# Patient Record
Sex: Female | Born: 2000
Health system: Southern US, Community
[De-identification: ages and names within clinical notes are randomized; demographics above are authoritative.]

---
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
## 2018-11-07 ENCOUNTER — Other Ambulatory Visit: Payer: Self-pay | Admitting: *Deleted

## 2018-11-07 ENCOUNTER — Other Ambulatory Visit: Payer: Self-pay

## 2018-11-07 DIAGNOSIS — Z20822 Contact with and (suspected) exposure to covid-19: Secondary | ICD-10-CM

## 2018-11-08 ENCOUNTER — Other Ambulatory Visit: Payer: Self-pay

## 2018-11-16 LAB — NOVEL CORONAVIRUS, NAA: SARS-CoV-2, NAA: NOT DETECTED

## 2020-07-07 ENCOUNTER — Ambulatory Visit: Payer: Medicaid Other | Attending: Internal Medicine

## 2020-07-07 ENCOUNTER — Other Ambulatory Visit (HOSPITAL_COMMUNITY): Payer: Self-pay | Admitting: Internal Medicine

## 2020-07-07 DIAGNOSIS — Z23 Encounter for immunization: Secondary | ICD-10-CM

## 2020-07-07 NOTE — Progress Notes (Signed)
   [facility]  Name:  Tae Robak    MRN: [id number] DOB: [birth year]  07/07/2020  Ms. Faso was observed post Covid-19 immunization for 15 minutes without incident. She was provided with Vaccine Information Sheet and instruction to access the V-Safe system.   Ms. Swaby was instructed to call 911 with any severe reactions post vaccine: Marland Kitchen Difficulty breathing  . Swelling of face and throat  . A fast heartbeat  . A bad rash all over body  . Dizziness and weakness   Immunizations Administered    Name Date Dose VIS Date Route   Moderna Covid-19 Booster Vaccine 07/07/2020 10:15 AM 0.25 mL 03/05/2020 Intramuscular   Manufacturer: Moderna   Lot: [id number]   NDC: 31517-616-07

## 2020-07-08 MED FILL — MODERNA COVID-19 VACCINE 10: 100 | 1 days supply | Qty: 0 | Fill #0
